# Patient Record
Sex: Female | Born: 1995
Health system: Southern US, Community
[De-identification: ages and names within clinical notes are randomized; demographics above are authoritative.]

## PROBLEM LIST (undated history)

## (undated) HISTORY — PX: TONSILLECTOMY: SUR1361

---
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
## 2015-09-20 IMAGING — CR DG KNEE 1-2V*R*
2 series · 2 of 2 positions shown · non-contrast
Comparison: None.

CLINICAL DATA: Knee pain and instability

EXAM:
RIGHT KNEE - 1-2 VIEW

[view not recorded (1 of 2)]
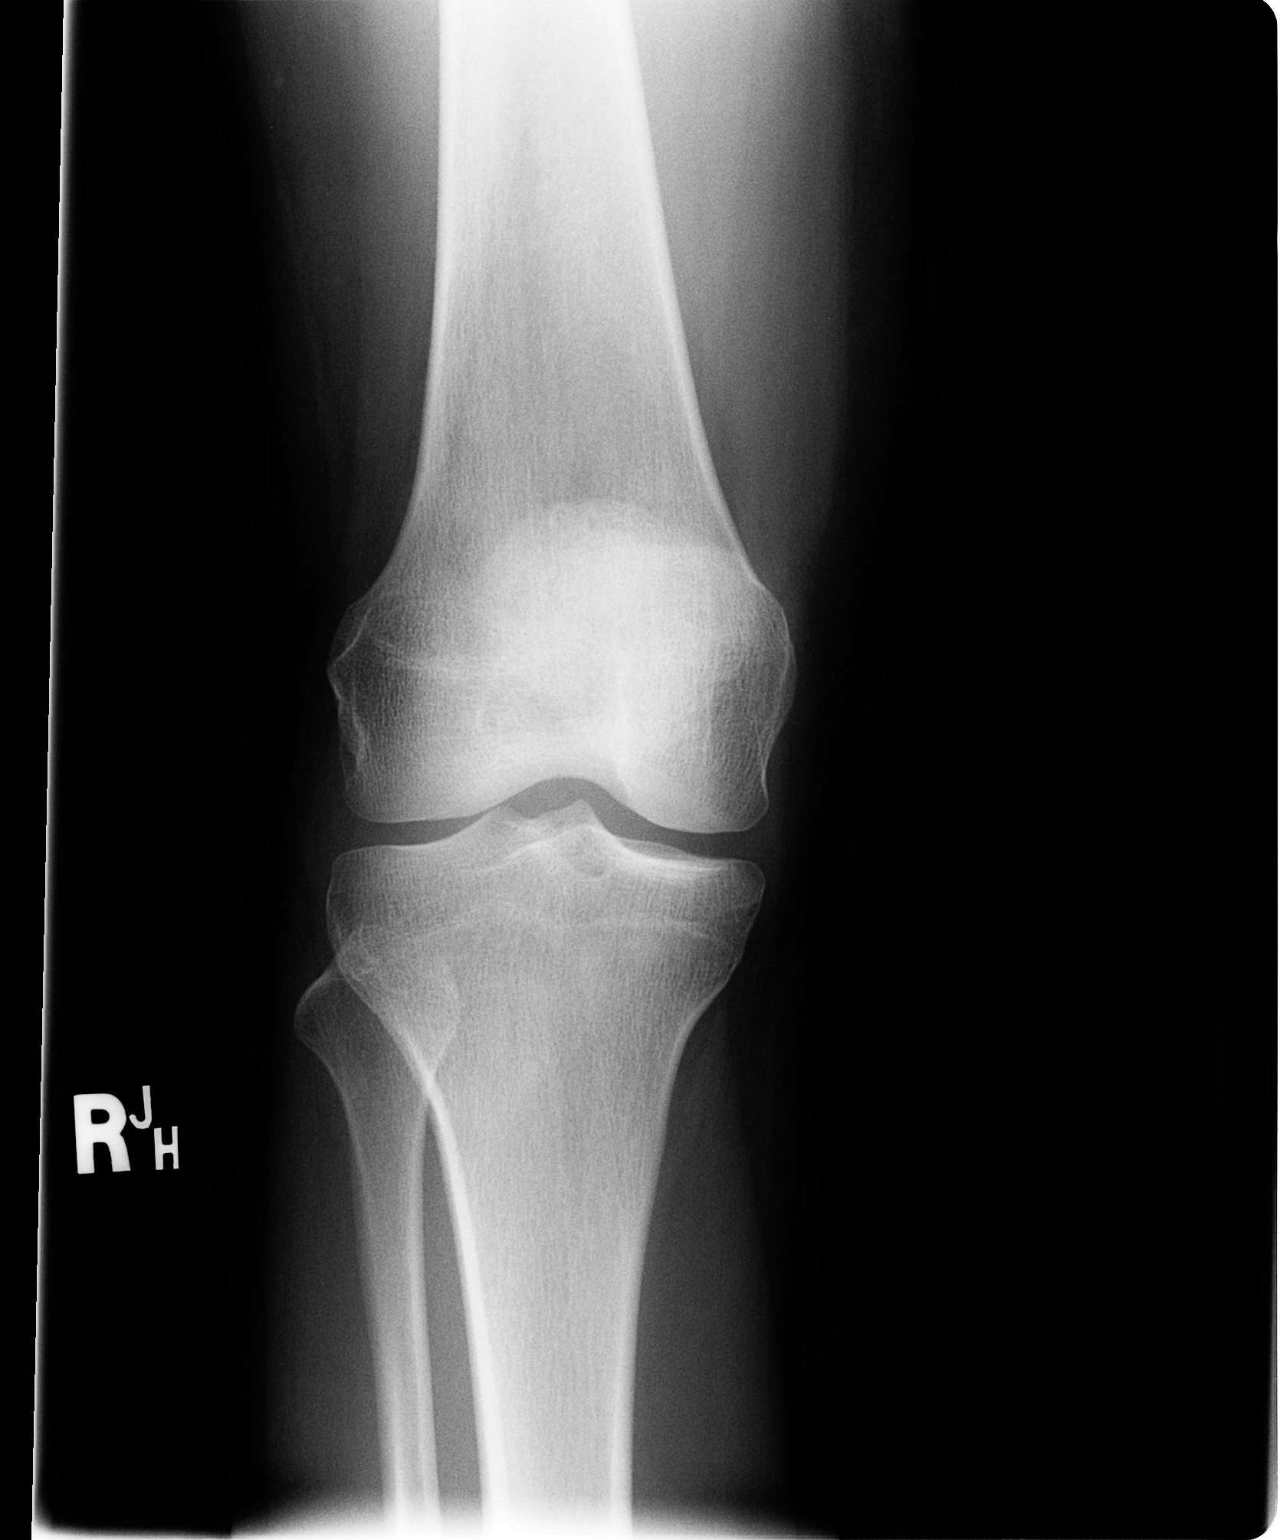

[view not recorded (2 of 2)]
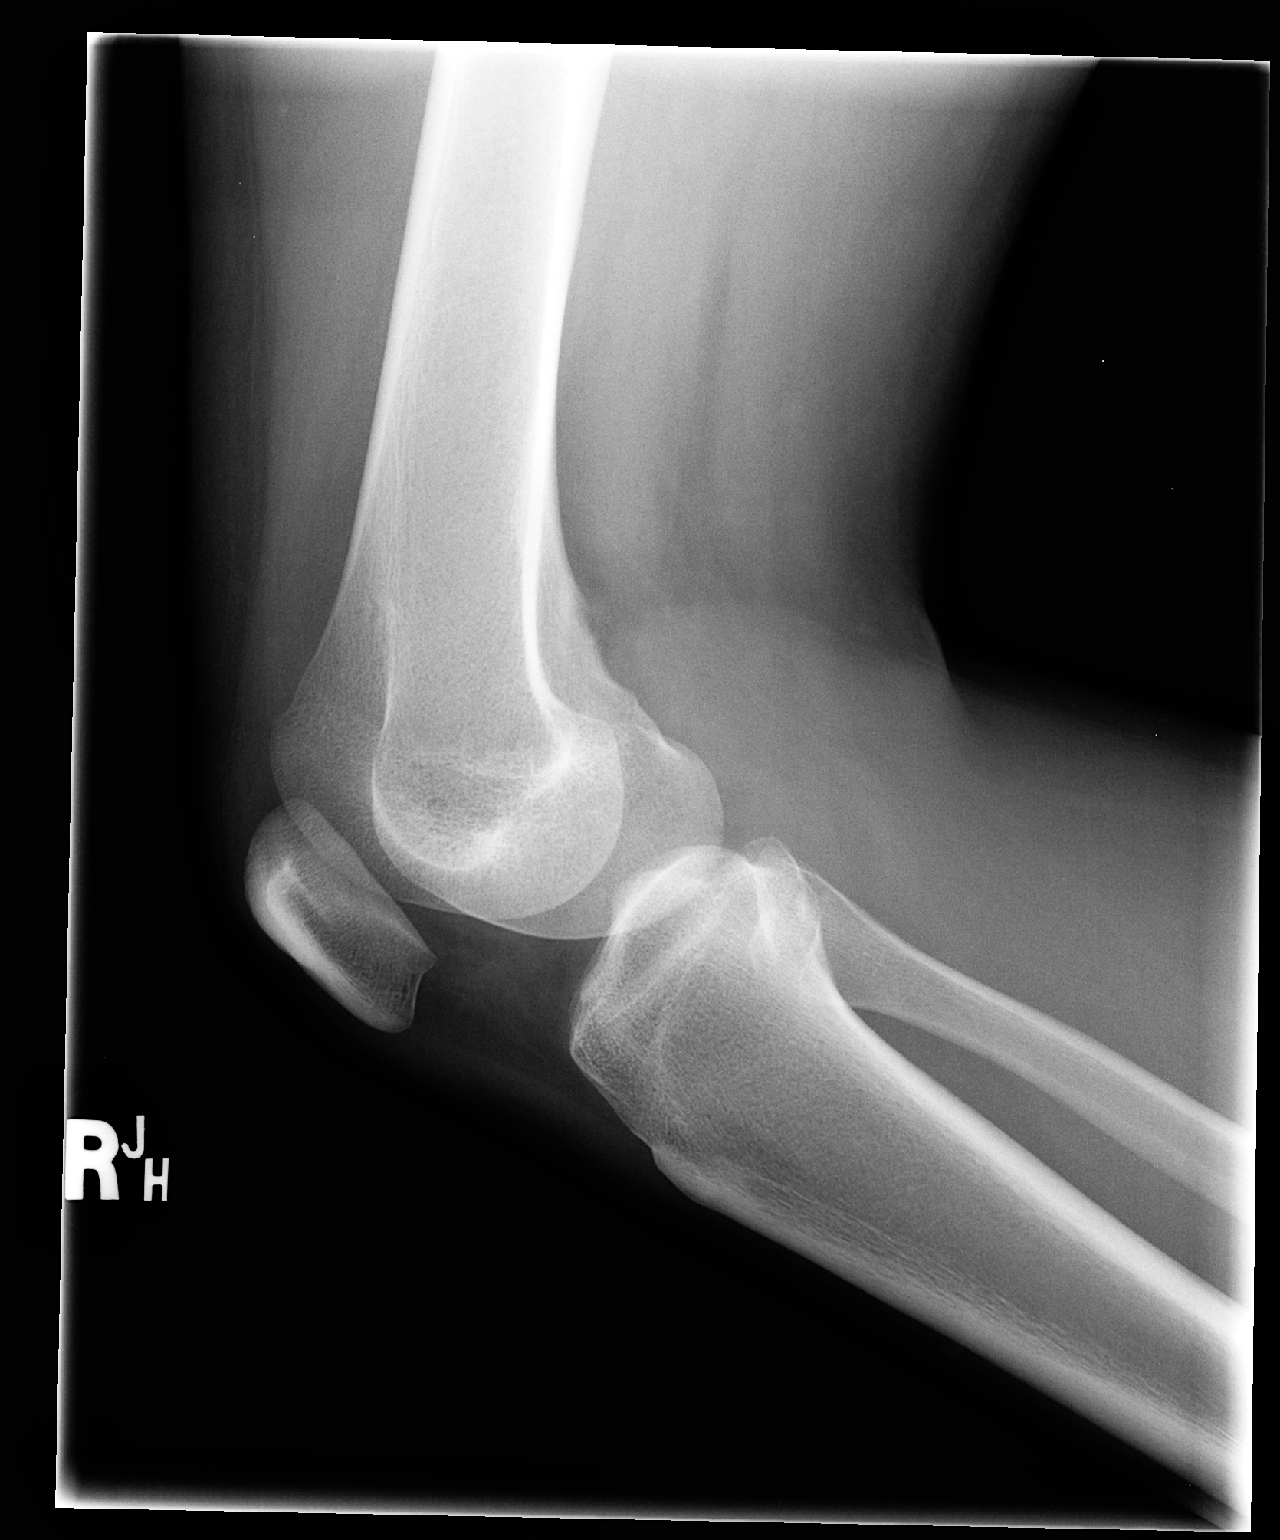

[2 of 2 positions shown; findings below may reference images not displayed]

FINDINGS: There is no evidence of fracture, dislocation, or joint effusion.
There is no evidence of arthropathy or other focal bone abnormality.
Soft tissues are unremarkable.
IMPRESSION: Negative.

## 2018-07-07 DIAGNOSIS — J00 Acute nasopharyngitis [common cold]: Secondary | ICD-10-CM | POA: Diagnosis not present

## 2018-12-05 DIAGNOSIS — Z01419 Encounter for gynecological examination (general) (routine) without abnormal findings: Secondary | ICD-10-CM | POA: Diagnosis not present

## 2018-12-05 DIAGNOSIS — R102 Pelvic and perineal pain: Secondary | ICD-10-CM | POA: Diagnosis not present

## 2018-12-05 DIAGNOSIS — R1084 Generalized abdominal pain: Secondary | ICD-10-CM | POA: Diagnosis not present

## 2018-12-15 DIAGNOSIS — R1030 Lower abdominal pain, unspecified: Secondary | ICD-10-CM | POA: Diagnosis not present

## 2019-04-26 DIAGNOSIS — L700 Acne vulgaris: Secondary | ICD-10-CM | POA: Diagnosis not present

## 2019-05-01 DIAGNOSIS — Z20828 Contact with and (suspected) exposure to other viral communicable diseases: Secondary | ICD-10-CM | POA: Diagnosis not present

## 2019-08-18 DIAGNOSIS — Z20828 Contact with and (suspected) exposure to other viral communicable diseases: Secondary | ICD-10-CM | POA: Diagnosis not present

## 2019-09-10 DIAGNOSIS — F418 Other specified anxiety disorders: Secondary | ICD-10-CM | POA: Diagnosis not present

## 2019-09-10 DIAGNOSIS — L539 Erythematous condition, unspecified: Secondary | ICD-10-CM | POA: Diagnosis not present

## 2019-09-10 DIAGNOSIS — R12 Heartburn: Secondary | ICD-10-CM | POA: Diagnosis not present

## 2019-09-10 DIAGNOSIS — L700 Acne vulgaris: Secondary | ICD-10-CM | POA: Diagnosis not present

## 2019-09-27 DIAGNOSIS — F418 Other specified anxiety disorders: Secondary | ICD-10-CM | POA: Diagnosis not present

## 2019-10-26 DIAGNOSIS — N809 Endometriosis, unspecified: Secondary | ICD-10-CM | POA: Diagnosis not present

## 2019-10-26 DIAGNOSIS — F332 Major depressive disorder, recurrent severe without psychotic features: Secondary | ICD-10-CM | POA: Diagnosis not present

## 2019-10-26 DIAGNOSIS — F418 Other specified anxiety disorders: Secondary | ICD-10-CM | POA: Diagnosis not present

## 2019-10-26 DIAGNOSIS — R12 Heartburn: Secondary | ICD-10-CM | POA: Diagnosis not present

## 2019-12-07 DIAGNOSIS — R12 Heartburn: Secondary | ICD-10-CM | POA: Diagnosis not present

## 2019-12-07 DIAGNOSIS — F4010 Social phobia, unspecified: Secondary | ICD-10-CM | POA: Diagnosis not present

## 2019-12-07 DIAGNOSIS — F332 Major depressive disorder, recurrent severe without psychotic features: Secondary | ICD-10-CM | POA: Diagnosis not present

## 2019-12-07 DIAGNOSIS — F418 Other specified anxiety disorders: Secondary | ICD-10-CM | POA: Diagnosis not present

## 2019-12-29 ENCOUNTER — Ambulatory Visit: Payer: Medicaid Other | Attending: Internal Medicine

## 2019-12-29 DIAGNOSIS — Z23 Encounter for immunization: Secondary | ICD-10-CM

## 2019-12-29 NOTE — Progress Notes (Signed)
   Covid-[facility] Vaccination [facility]  Name:  Theresa Glover    MRN: [id number] DOB: 1996-06-13  12/29/2019  [name] was observed post Covid-19 immunization for 15 minutes without incident. She was provided with Vaccine Information Sheet and instruction to access the V-Safe system.   [name] was instructed to call 911 with any severe reactions post vaccine: Marland Kitchen Difficulty breathing  . Swelling of face and throat  . A fast heartbeat  . A bad rash all over body  . Dizziness and weakness   Immunizations Administered    Name Date Dose VIS Date Route   Pfizer COVID-19 Vaccine 12/29/2019  8:29 AM 0.3 mL 10/24/2018 Intramuscular   Manufacturer: [employer], [employer]   Lot: [id number]   NDC: [address]-2

## 2020-01-15 DIAGNOSIS — Z792 Long term (current) use of antibiotics: Secondary | ICD-10-CM | POA: Diagnosis not present

## 2020-01-15 DIAGNOSIS — W5501XA Bitten by cat, initial encounter: Secondary | ICD-10-CM | POA: Diagnosis not present

## 2020-01-15 DIAGNOSIS — S81852A Open bite, left lower leg, initial encounter: Secondary | ICD-10-CM | POA: Diagnosis not present

## 2020-01-16 ENCOUNTER — Encounter: Payer: Self-pay | Admitting: Emergency Medicine

## 2020-01-16 ENCOUNTER — Emergency Department (HOSPITAL_BASED_OUTPATIENT_CLINIC_OR_DEPARTMENT_OTHER)
Admission: EM | Admit: 2020-01-16 | Discharge: 2020-01-16 | Disposition: A | Discharge status: Home / Self Care | Payer: BC Managed Care – PPO | Attending: Emergency Medicine | Admitting: Emergency Medicine

## 2020-01-16 DIAGNOSIS — Z23 Encounter for immunization: Secondary | ICD-10-CM | POA: Insufficient documentation

## 2020-01-16 DIAGNOSIS — W5501XD Bitten by cat, subsequent encounter: Secondary | ICD-10-CM | POA: Insufficient documentation

## 2020-01-16 DIAGNOSIS — Z2914 Encounter for prophylactic rabies immune globin: Secondary | ICD-10-CM | POA: Insufficient documentation

## 2020-01-16 DIAGNOSIS — W5501XA Bitten by cat, initial encounter: Secondary | ICD-10-CM

## 2020-01-16 DIAGNOSIS — Z203 Contact with and (suspected) exposure to rabies: Secondary | ICD-10-CM | POA: Diagnosis not present

## 2020-01-16 DIAGNOSIS — S61452D Open bite of left hand, subsequent encounter: Secondary | ICD-10-CM | POA: Insufficient documentation

## 2020-01-16 DIAGNOSIS — S81852D Open bite, left lower leg, subsequent encounter: Secondary | ICD-10-CM | POA: Insufficient documentation

## 2020-01-16 DIAGNOSIS — S81852A Open bite, left lower leg, initial encounter: Secondary | ICD-10-CM | POA: Diagnosis not present

## 2020-01-16 MED ORDER — RABIES IMMUNE GLOBULIN 150 UNIT/ML IM INJ
20.0000 [IU]/kg | INJECTION | Freq: Once | INTRAMUSCULAR | Status: AC
  Administered 2020-01-16: 1425 [IU]
  Filled 2020-01-16: qty 10

## 2020-01-16 MED ORDER — RABIES VACCINE, PCEC IM SUSR
1.0000 mL | Freq: Once | INTRAMUSCULAR | Status: AC
  Administered 2020-01-16: 1 mL via INTRAMUSCULAR
  Filled 2020-01-16: qty 1

## 2020-01-16 NOTE — ED Triage Notes (Signed)
Pt states she was scratched and bit by a random cat yesterday  Pt went to the dr and they started her on an antibiotic and told her to come to the ED for rabies vaccines

## 2020-01-16 NOTE — ED Provider Notes (Addendum)
[facility] EMERGENCY DEPARTMENT Provider Note   CSN: [id number] Arrival date & time: 01/16/20  4097     History Chief Complaint  Patient presents with  . Animal Bite    Theresa Glover is a 24 y.o. female.  The history is provided by the patient.  Animal Bite Contact animal:  Cat Location:  Leg Leg injury location:  L lower leg Time since incident:  1 day Pain details:    Quality:  Aching   Severity:  Mild   Timing:  Constant   Progression:  Unchanged Incident location:  Outside Provoked: unprovoked   Notifications:  Animal control Animal's rabies vaccination status:  Unknown Animal in possession: no   Tetanus status:  Up to date Relieved by:  Nothing Worsened by:  Nothing Ineffective treatments:  None tried Associated symptoms: no fever, no numbness, no rash and no swelling   Seen by a doctor yesterday for cat bites from an unknown cat yesterday and told to come to the ER for vaccines.  Cat is not in possession.       History reviewed. No pertinent past medical history.  There are no problems to display for this patient.   Past Surgical History:  Procedure Laterality Date  . TONSILLECTOMY       OB History   No obstetric history on file.     History reviewed. No pertinent family history.  Social History   Tobacco Use  . Smoking status: Never Smoker  . Smokeless tobacco: Never Used  Substance Use Topics  . Alcohol use: Yes    Comment: social  . Drug use: Never    Home Medications Prior to Admission medications   Not on File    Allergies    Patient has no known allergies.  Review of Systems   Review of Systems  Constitutional: Negative for fever.  HENT: Negative for congestion.   Eyes: Negative for visual disturbance.  Respiratory: Negative for shortness of breath.   Cardiovascular: Negative for chest pain.  Gastrointestinal: Negative for abdominal pain.  Genitourinary: Negative for difficulty urinating.    Musculoskeletal: Negative for arthralgias.  Skin: Negative for rash.  Neurological: Negative for numbness.  Psychiatric/Behavioral: Negative for agitation.  All other systems reviewed and are negative.   Physical Exam Updated Vital Signs Ht 5\' 6"  (1.676 m)   Wt 73 kg   LMP 01/11/2020 (Exact Date)   BMI 25.99 kg/m   Physical Exam Vitals and nursing note reviewed.  Constitutional:      General: She is not in acute distress.    Appearance: Normal appearance.  HENT:     Head: Normocephalic and atraumatic.     Nose: Nose normal.  Eyes:     Extraocular Movements: Extraocular movements intact.  Cardiovascular:     Rate and Rhythm: Normal rate and regular rhythm.     Pulses: Normal pulses.     Heart sounds: Normal heart sounds.  Pulmonary:     Effort: Pulmonary effort is normal.     Breath sounds: Normal breath sounds.  Abdominal:     General: Abdomen is flat. Bowel sounds are normal.     Tenderness: There is no abdominal tenderness. There is no guarding.  Musculoskeletal:        General: Normal range of motion.     Cervical back: Normal range of motion and neck supple.       Legs:  Skin:    General: Skin is warm and dry.     Capillary  Refill: Capillary refill takes less than 2 seconds.  Neurological:     General: No focal deficit present.     Mental Status: She is alert and oriented to person, place, and time.     Deep Tendon Reflexes: Reflexes normal.  Psychiatric:        Mood and Affect: Mood normal.        Behavior: Behavior normal.     ED Results / Procedures / Treatments   Labs (all labs ordered are listed, but only abnormal results are displayed) Labs Reviewed - No data to display  EKG None  Radiology No results found.  Procedures Procedures (including critical care time)  Medications Ordered in ED Medications  rabies vaccine (RABAVERT) injection 1 mL (has no administration in time range)  rabies immune globulin (HYPERAB/KEDRAB) injection 1,425  Units (has no administration in time range)    ED Course  I have reviewed the triage vital signs and the nursing notes.  Pertinent labs & imaging results that were available during my care of the patient were reviewed by me and considered in my medical decision making (see chart for details).    On antibiotics.  Vaccine schedule given.  No signs of infection at this time.  Monitored in the ED post immunoglobulin and vaccine for reaction.  None appears.  Stable for discharge with close follow up.    Theresa Glover was evaluated in Emergency Department on 01/16/2020 for the symptoms described in the history of present illness. She was evaluated in the context of the global COVID-19 pandemic, which necessitated consideration that the patient might be at risk for infection with the SARS-CoV-2 virus that causes COVID-19. Institutional protocols and algorithms that pertain to the evaluation of patients at risk for COVID-19 are in a state of rapid change based on information released by regulatory bodies including the CDC and federal and state organizations. These policies and algorithms were followed during the patient's care in the ED.  Final Clinical Impression(s) / ED Diagnoses Final diagnoses:  Cat bite, initial encounter     Return for intractable cough, coughing up blood,fevers >100.4 unrelieved by medication, shortness of breath, intractable vomiting, chest pain, shortness of breath, weakness,numbness, changes in speech, facial asymmetry,abdominal pain, passing out,Inability to tolerate liquids or food, cough, altered mental status or any concerns. No signs of systemic illness or infection. The patient is nontoxic-appearing on exam and vital signs are within normal limits.   I have reviewed the triage vital signs and the nursing notes. Pertinent labs &imaging results that were available during my care of the patient were reviewed by me and considered in my medical decision making  (see chart for details).After history, exam, and medical workup I feel the patient has beenappropriately medically screened and is safe for discharge home. Pertinent diagnoses were discussed with the patient. Patient was given return precautions.    Rx / DC Orders ED Discharge Orders    None        Zonia Caplin, MD 01/16/20 [phone]

## 2020-01-17 DIAGNOSIS — F418 Other specified anxiety disorders: Secondary | ICD-10-CM | POA: Diagnosis not present

## 2020-01-17 DIAGNOSIS — F332 Major depressive disorder, recurrent severe without psychotic features: Secondary | ICD-10-CM | POA: Diagnosis not present

## 2020-01-17 DIAGNOSIS — F4010 Social phobia, unspecified: Secondary | ICD-10-CM | POA: Diagnosis not present

## 2020-01-17 DIAGNOSIS — L700 Acne vulgaris: Secondary | ICD-10-CM | POA: Diagnosis not present

## 2020-01-19 DIAGNOSIS — S81852A Open bite, left lower leg, initial encounter: Secondary | ICD-10-CM | POA: Diagnosis not present

## 2020-01-19 DIAGNOSIS — Z2914 Encounter for prophylactic rabies immune globin: Secondary | ICD-10-CM | POA: Diagnosis not present

## 2020-01-19 DIAGNOSIS — W5501XA Bitten by cat, initial encounter: Secondary | ICD-10-CM | POA: Diagnosis not present

## 2020-01-19 DIAGNOSIS — Z23 Encounter for immunization: Secondary | ICD-10-CM | POA: Diagnosis not present

## 2020-01-19 DIAGNOSIS — S81852D Open bite, left lower leg, subsequent encounter: Secondary | ICD-10-CM | POA: Diagnosis not present

## 2020-01-21 ENCOUNTER — Ambulatory Visit: Payer: Medicaid Other

## 2020-01-23 ENCOUNTER — Ambulatory Visit (HOSPITAL_COMMUNITY)
Admission: EM | Admit: 2020-01-23 | Discharge: 2020-01-23 | Disposition: A | Discharge status: Home / Self Care | Payer: BC Managed Care – PPO | Attending: Family Medicine | Admitting: Family Medicine

## 2020-01-23 DIAGNOSIS — Z203 Contact with and (suspected) exposure to rabies: Secondary | ICD-10-CM

## 2020-01-23 MED ORDER — RABIES VACCINE, PCEC IM SUSR
INTRAMUSCULAR | Status: AC
  Filled 2020-01-23: qty 1

## 2020-01-23 MED ORDER — RABIES VACCINE, PCEC IM SUSR
1.0000 mL | Freq: Once | INTRAMUSCULAR | Status: AC
  Administered 2020-01-23: 1 mL via INTRAMUSCULAR

## 2020-01-31 ENCOUNTER — Other Ambulatory Visit: Payer: Self-pay

## 2020-01-31 ENCOUNTER — Ambulatory Visit (HOSPITAL_COMMUNITY)
Admission: EM | Admit: 2020-01-31 | Discharge: 2020-01-31 | Disposition: A | Discharge status: Home / Self Care | Payer: BC Managed Care – PPO | Attending: Internal Medicine | Admitting: Internal Medicine

## 2020-01-31 DIAGNOSIS — Z203 Contact with and (suspected) exposure to rabies: Secondary | ICD-10-CM

## 2020-01-31 DIAGNOSIS — Z23 Encounter for immunization: Secondary | ICD-10-CM | POA: Diagnosis not present

## 2020-01-31 MED ORDER — RABIES VACCINE, PCEC IM SUSR
1.0000 mL | Freq: Once | INTRAMUSCULAR | Status: AC
  Administered 2020-01-31: 1 mL via INTRAMUSCULAR

## 2020-01-31 MED ORDER — RABIES VACCINE, PCEC IM SUSR
INTRAMUSCULAR | Status: AC
  Filled 2020-01-31: qty 1

## 2020-07-01 DIAGNOSIS — F332 Major depressive disorder, recurrent severe without psychotic features: Secondary | ICD-10-CM | POA: Diagnosis not present

## 2020-07-01 DIAGNOSIS — L700 Acne vulgaris: Secondary | ICD-10-CM | POA: Diagnosis not present

## 2020-07-01 DIAGNOSIS — R12 Heartburn: Secondary | ICD-10-CM | POA: Diagnosis not present

## 2020-07-01 DIAGNOSIS — F418 Other specified anxiety disorders: Secondary | ICD-10-CM | POA: Diagnosis not present
